# Patient Record
Sex: Male | Born: 1995 | Race: White | Hispanic: Yes | Marital: Married
Health system: Southern US, Community
[De-identification: ages and names within clinical notes are randomized; demographics above are authoritative.]

---

## 2020-03-22 ENCOUNTER — Other Ambulatory Visit: Payer: Self-pay

## 2020-03-22 ENCOUNTER — Emergency Department
Admission: EM | Admit: 2020-03-22 | Discharge: 2020-03-22 | Disposition: A | Discharge status: Home / Self Care | Payer: Self-pay | Attending: Emergency Medicine | Admitting: Emergency Medicine

## 2020-03-22 ENCOUNTER — Encounter: Payer: Self-pay | Admitting: Emergency Medicine

## 2020-03-22 ENCOUNTER — Emergency Department: Payer: Self-pay

## 2020-03-22 DIAGNOSIS — R0602 Shortness of breath: Secondary | ICD-10-CM | POA: Insufficient documentation

## 2020-03-22 DIAGNOSIS — Y904 Blood alcohol level of 80-99 mg/100 ml: Secondary | ICD-10-CM | POA: Insufficient documentation

## 2020-03-22 DIAGNOSIS — R079 Chest pain, unspecified: Secondary | ICD-10-CM

## 2020-03-22 DIAGNOSIS — M79662 Pain in left lower leg: Secondary | ICD-10-CM | POA: Insufficient documentation

## 2020-03-22 DIAGNOSIS — K2920 Alcoholic gastritis without bleeding: Secondary | ICD-10-CM

## 2020-03-22 LAB — LIPASE, BLOOD: Lipase: 42 U/L (ref 11–51)

## 2020-03-22 LAB — BASIC METABOLIC PANEL
Anion gap: 10 (ref 5–15)
BUN: 13 mg/dL (ref 6–20)
CO2: 22 mmol/L (ref 22–32)
Calcium: 8.7 mg/dL — ABNORMAL LOW (ref 8.9–10.3)
Chloride: 107 mmol/L (ref 98–111)
Creatinine, Ser: 0.8 mg/dL (ref 0.61–1.24)
GFR, Estimated: >60 mL/min (ref >60)
Glucose, Bld: 120 mg/dL — ABNORMAL HIGH (ref 70–99)
Potassium: 3.1 mmol/L — ABNORMAL LOW (ref 3.5–5.1)
Sodium: 139 mmol/L (ref 135–145)

## 2020-03-22 LAB — HEPATIC FUNCTION PANEL
ALT: 21 U/L (ref 0–44)
AST: 27 U/L (ref 15–41)
Albumin: 4.8 g/dL (ref 3.5–5.0)
Alkaline Phosphatase: 59 U/L (ref 38–126)
Bilirubin, Direct: 0.1 mg/dL (ref 0.0–0.2)
Indirect Bilirubin: 0.7 mg/dL (ref 0.3–0.9)
Total Bilirubin: 0.8 mg/dL (ref 0.3–1.2)
Total Protein: 7.1 g/dL (ref 6.5–8.1)

## 2020-03-22 LAB — CBC
HCT: 44.9 % (ref 39.0–52.0)
Hemoglobin: 16.1 g/dL (ref 13.0–17.0)
MCH: 33.5 pg (ref 26.0–34.0)
MCHC: 35.9 g/dL (ref 30.0–36.0)
MCV: 93.5 fL (ref 80.0–100.0)
Platelets: 199 10*3/uL (ref 150–400)
RBC: 4.8 MIL/uL (ref 4.22–5.81)
RDW: 10.9 % — ABNORMAL LOW (ref 11.5–15.5)
WBC: 8 10*3/uL (ref 4.0–10.5)
nRBC: 0 % (ref 0.0–0.2)

## 2020-03-22 LAB — TROPONIN I (HIGH SENSITIVITY)
Troponin I (High Sensitivity): <2 ng/L (ref <18)
Troponin I (High Sensitivity): <2 ng/L (ref <18)

## 2020-03-22 LAB — ETHANOL: Alcohol, Ethyl (B): 99 mg/dL — ABNORMAL HIGH (ref <10)

## 2020-03-22 LAB — D-DIMER, QUANTITATIVE: D-Dimer, Quant: <0.27 ug/mL-FEU (ref 0.00–0.50)

## 2020-03-22 MED ORDER — OMEPRAZOLE MAGNESIUM 20 MG PO TBEC: 20.0000 mg | DELAYED_RELEASE_TABLET | Freq: Every day | ORAL | 1 refills | Status: AC

## 2020-03-22 MED ORDER — ALUM & MAG HYDROXIDE-SIMETH 200-200-20 MG/5ML PO SUSP
15.0000 mL | Freq: Once | ORAL | Status: AC
  Administered 2020-03-22: 15 mL via ORAL
  Filled 2020-03-22: qty 30

## 2020-03-22 MED ORDER — LACTATED RINGERS IV BOLUS
1000.0000 mL | Freq: Once | INTRAVENOUS | Status: AC
  Administered 2020-03-22: 1000 mL via INTRAVENOUS

## 2020-03-22 MED ORDER — POTASSIUM CHLORIDE CRYS ER 20 MEQ PO TBCR
40.0000 meq | EXTENDED_RELEASE_TABLET | Freq: Once | ORAL | Status: AC
  Administered 2020-03-22: 40 meq via ORAL
  Filled 2020-03-22: qty 2

## 2020-03-22 MED ORDER — LIDOCAINE VISCOUS HCL 2 % MT SOLN
15.0000 mL | Freq: Once | OROMUCOSAL | Status: AC
  Administered 2020-03-22: 15 mL via ORAL
  Filled 2020-03-22: qty 15

## 2020-03-22 MED ORDER — ONDANSETRON HCL 4 MG/2ML IJ SOLN
4.0000 mg | Freq: Once | INTRAMUSCULAR | Status: AC
  Administered 2020-03-22: 4 mg via INTRAVENOUS
  Filled 2020-03-22: qty 2

## 2020-03-22 NOTE — ED Triage Notes (Addendum)
Information obtained with assist of spanish interpreter with [facility] interpreter. pAIN BEGAN five hours ago. Pt states has been nauseated and shob. Pt appears intoxicated, having difficulty obtaining information from pt as pt does not answer questions and is intermittently vomiting in triage.

## 2020-03-22 NOTE — ED Provider Notes (Signed)
[facility] Emergency Department Provider Note   ____________________________________________   Event Date/Time   First MD Initiated Contact with Patient 03/22/20 [id number]     (approximate)  I have reviewed the triage vital signs and the nursing notes.   HISTORY  Chief Complaint Chest Pain    HPI David Hunt is a 25 y.o. male with no significant past medical history who presents to the ED complaining of chest pain.  History is limited as patient is Spanish-speaking only and history obtained via in person interpreter.  Patient reports that for the past 24 hours he has been feeling nauseous with multiple episodes of vomiting, denies associated diarrhea but has some pain in his epigastrium.  He then developed pain in his chest about 5 hours ago.  He states this primarily affects the left side of his chest, is sharp and intermittent.  It is not exacerbated or alleviated by anything in particular and he states he feels a little short of breath when the pain is bad, denies fevers or cough.  He does admit to alcohol consumption overnight, about 5 or 6 beers, but denies drug use.  He denies any medical problems or cardiac history.  He does report some soreness in his left calf for the past couple of weeks, has not noticed any redness or swelling.        History reviewed. No pertinent past medical history.  There are no problems to display for this patient.   History reviewed. No pertinent surgical history.  Prior to Admission medications   Medication Sig Start Date End Date Taking? Authorizing Provider  omeprazole (PRILOSEC OTC) 20 MG tablet Take 1 tablet (20 mg total) by mouth daily. 03/22/20 03/22/21 Yes Chesley Noon, MD    Allergies Patient has no known allergies.  No family history on file.  Social History    Review of Systems  Constitutional: No fever/chills Eyes: No visual changes. ENT: No sore throat. Cardiovascular: Positive for chest  pain. Respiratory: Positive for shortness of breath. Gastrointestinal: Positive for abdominal pain, nausea, and vomiting.  No diarrhea.  No constipation. Genitourinary: Negative for dysuria. Musculoskeletal: Negative for back pain.  Positive for left leg pain. Skin: Negative for rash. Neurological: Negative for headaches, focal weakness or numbness.  ____________________________________________   PHYSICAL EXAM:  VITAL SIGNS: ED Triage Vitals  Enc Vitals Group     BP 03/22/20 0625 126/76     Pulse Rate 03/22/20 0625 (!) 123     Resp 03/22/20 0625 16     Temp 03/22/20 0634 98.1 F (36.7 C)     Temp Source 03/22/20 0634 Oral     SpO2 03/22/20 0625 99 %     Weight 03/22/20 0616 140 lb (63.5 kg)     Height 03/22/20 0616 5\' 4"  (1.626 m)     Head Circumference --      Peak Flow --      Pain Score 03/22/20 0610 8     Pain Loc --      Pain Edu? --      Excl. in GC? --     Constitutional: Alert and oriented. Eyes: Conjunctivae are normal. Head: Atraumatic. Nose: No congestion/rhinnorhea. Mouth/Throat: Mucous membranes are moist. Neck: Normal ROM Cardiovascular: Tachycardic, regular rhythm. Grossly normal heart sounds. Respiratory: Normal respiratory effort.  No retractions. Lungs CTAB.  No chest wall tenderness to palpation. Gastrointestinal: Soft and nontender. No distention. Genitourinary: deferred Musculoskeletal: No lower extremity tenderness nor edema. Neurologic:  Normal speech and language.  No gross focal neurologic deficits are appreciated. Skin:  Skin is warm, dry and intact. No rash noted. Psychiatric: Mood and affect are normal. Speech and behavior are normal.  ____________________________________________   LABS (all labs ordered are listed, but only abnormal results are displayed)  Labs Reviewed  BASIC METABOLIC PANEL - Abnormal; Notable for the following components:      Result Value   Potassium 3.1 (*)    Glucose, Bld 120 (*)    Calcium 8.7 (*)    All  other components within normal limits  CBC - Abnormal; Notable for the following components:   RDW 10.9 (*)    All other components within normal limits  ETHANOL - Abnormal; Notable for the following components:   Alcohol, Ethyl (B) 99 (*)    All other components within normal limits  D-DIMER, QUANTITATIVE  LIPASE, BLOOD  HEPATIC FUNCTION PANEL  TROPONIN I (HIGH SENSITIVITY)  TROPONIN I (HIGH SENSITIVITY)   ____________________________________________  EKG  ED ECG REPORT I, Chesley Noon, the attending physician, personally viewed and interpreted this ECG.   Date: 03/22/2020  EKG Time: 6:18  Rate: 123  Rhythm: sinus tachycardia  Axis: RAD  Intervals:none  ST&T Change: None   PROCEDURES  Procedure(s) performed (including Critical Care):  Procedures   ____________________________________________   INITIAL IMPRESSION / ASSESSMENT AND PLAN / ED COURSE       25 year old male with no significant past medical history presents to the ED with 24 hours of nausea and vomiting with some epigastric soreness, subsequently developed sharp pain in his left chest about 5 hours ago.  EKG shows no evidence of arrhythmia or ischemia, does show sinus tachycardia.  Troponin is negative and symptoms are very atypical for ACS with minimal risk factors.  Chest x-ray reviewed by me and shows no infiltrate, edema, or effusion.  With his tachycardia and reported left calf soreness, we will check D-dimer to further assess for PE as he is low risk by [place].  Left lower extremity appears normal to inspection with no tenderness, edema, or erythema.  Patient is slightly intoxicated and with his vomiting his chest pain seems most likely related to GERD and esophagitis.  We will treat with Zofran and GI cocktail while awaiting results.  D-dimer is within normal limits and patient's tachycardia has improved following IV fluids.  Repeat troponin is also negative and patient reports feeling much better  following GI cocktail.  Suspect his symptoms are related to GERD and a component of alcoholic gastritis.  We will start patient on a PPI and he is appropriate for discharge home with PCP follow-up.  He was counseled to return to the ED for new worsening symptoms, patient agrees with plan.      ____________________________________________   FINAL CLINICAL IMPRESSION(S) / ED DIAGNOSES  Final diagnoses:  Nonspecific chest pain  Acute alcoholic gastritis without hemorrhage     ED Discharge Orders         Ordered    omeprazole (PRILOSEC OTC) 20 MG tablet  Daily        03/22/20 0933           Note:  This document was prepared using Dragon voice recognition software and may include unintentional dictation errors.   Chesley Noon, MD 03/22/20 [id number]

## 2022-04-11 IMAGING — CR DG CHEST 2V
1 series · 2 of 2 positions shown · non-contrast
Comparison: No pertinent prior exams available for comparison.

CLINICAL DATA: Chest pain. Additional history provided: Nausea and
shortness of breath.

EXAM:
CHEST - 2 VIEW

[Series 1: dg chest 2 view · 0.14mm/px · 2 of 2 slices shown]
[im 1/2]
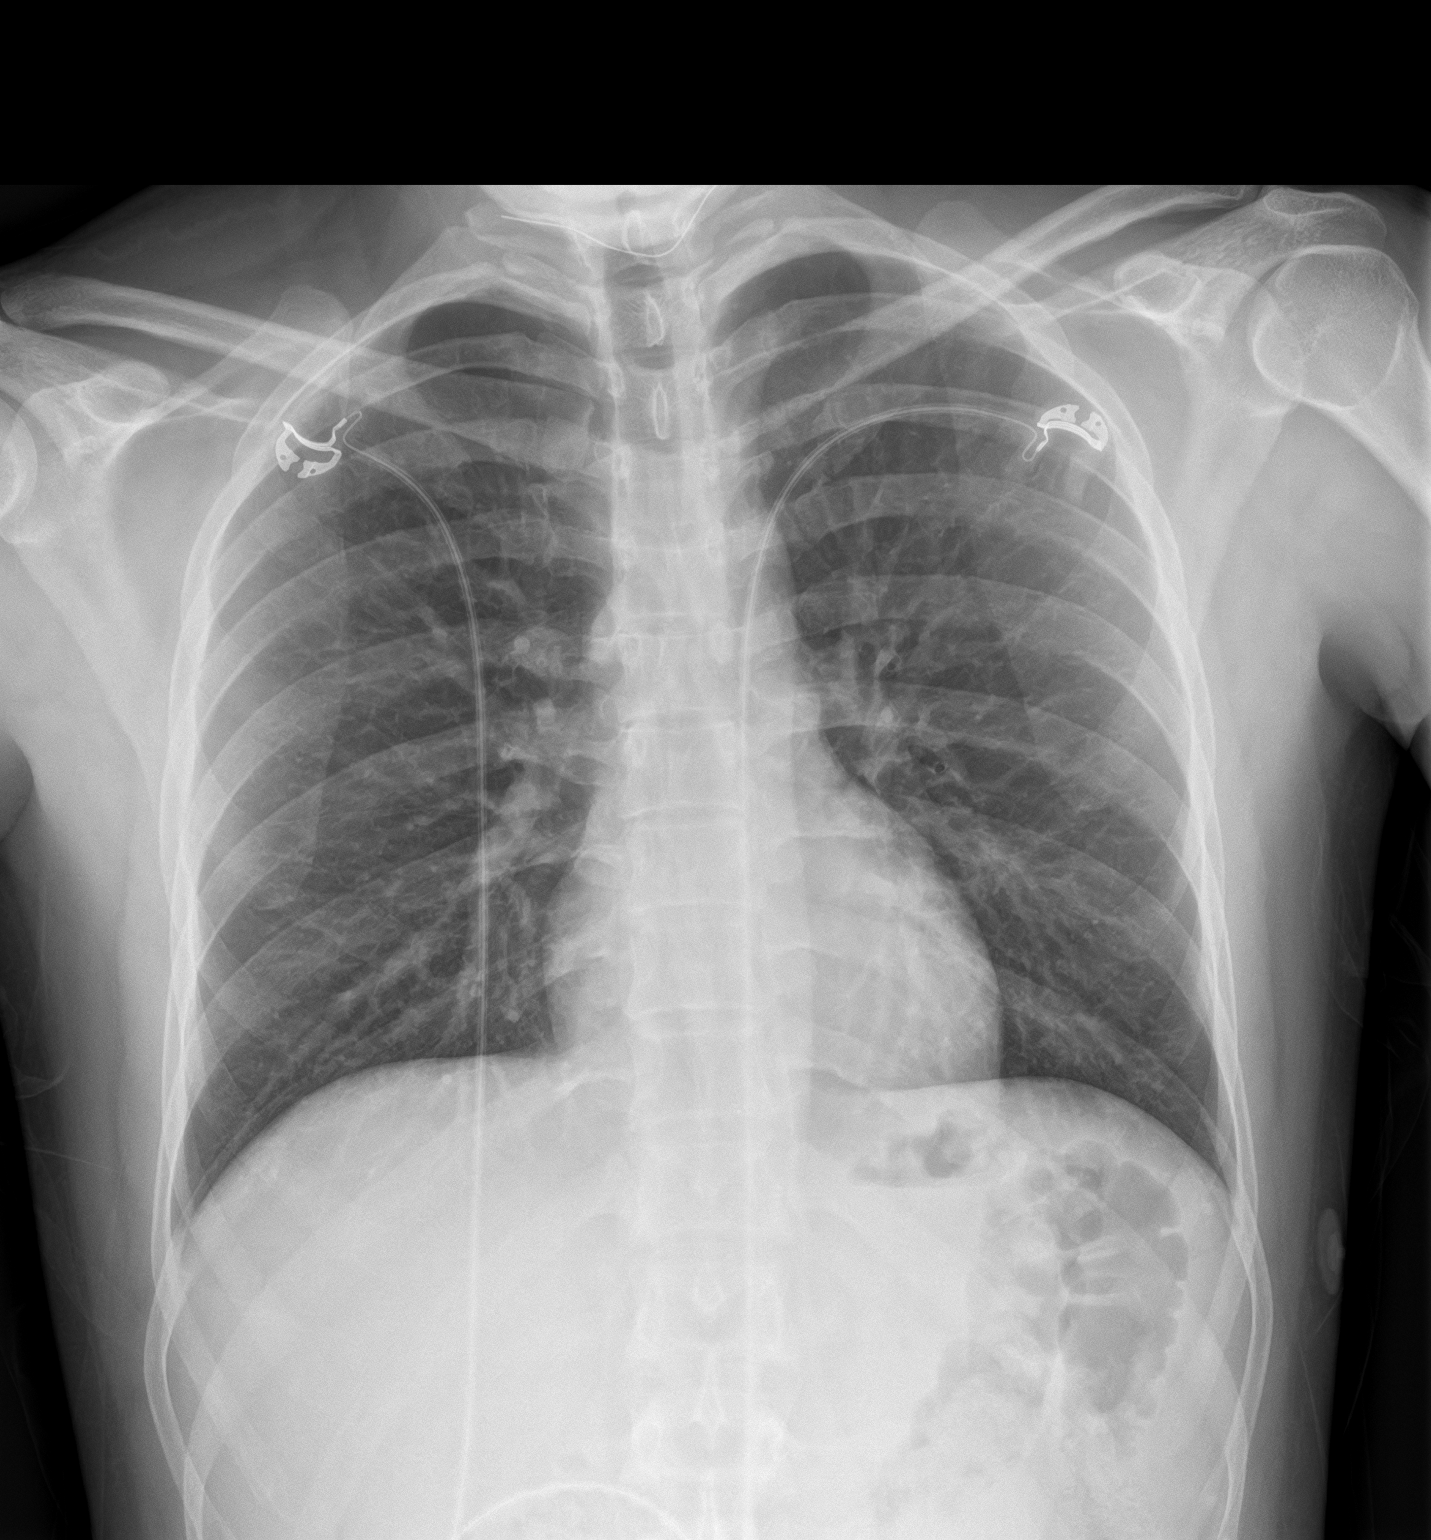
[im 2/2]
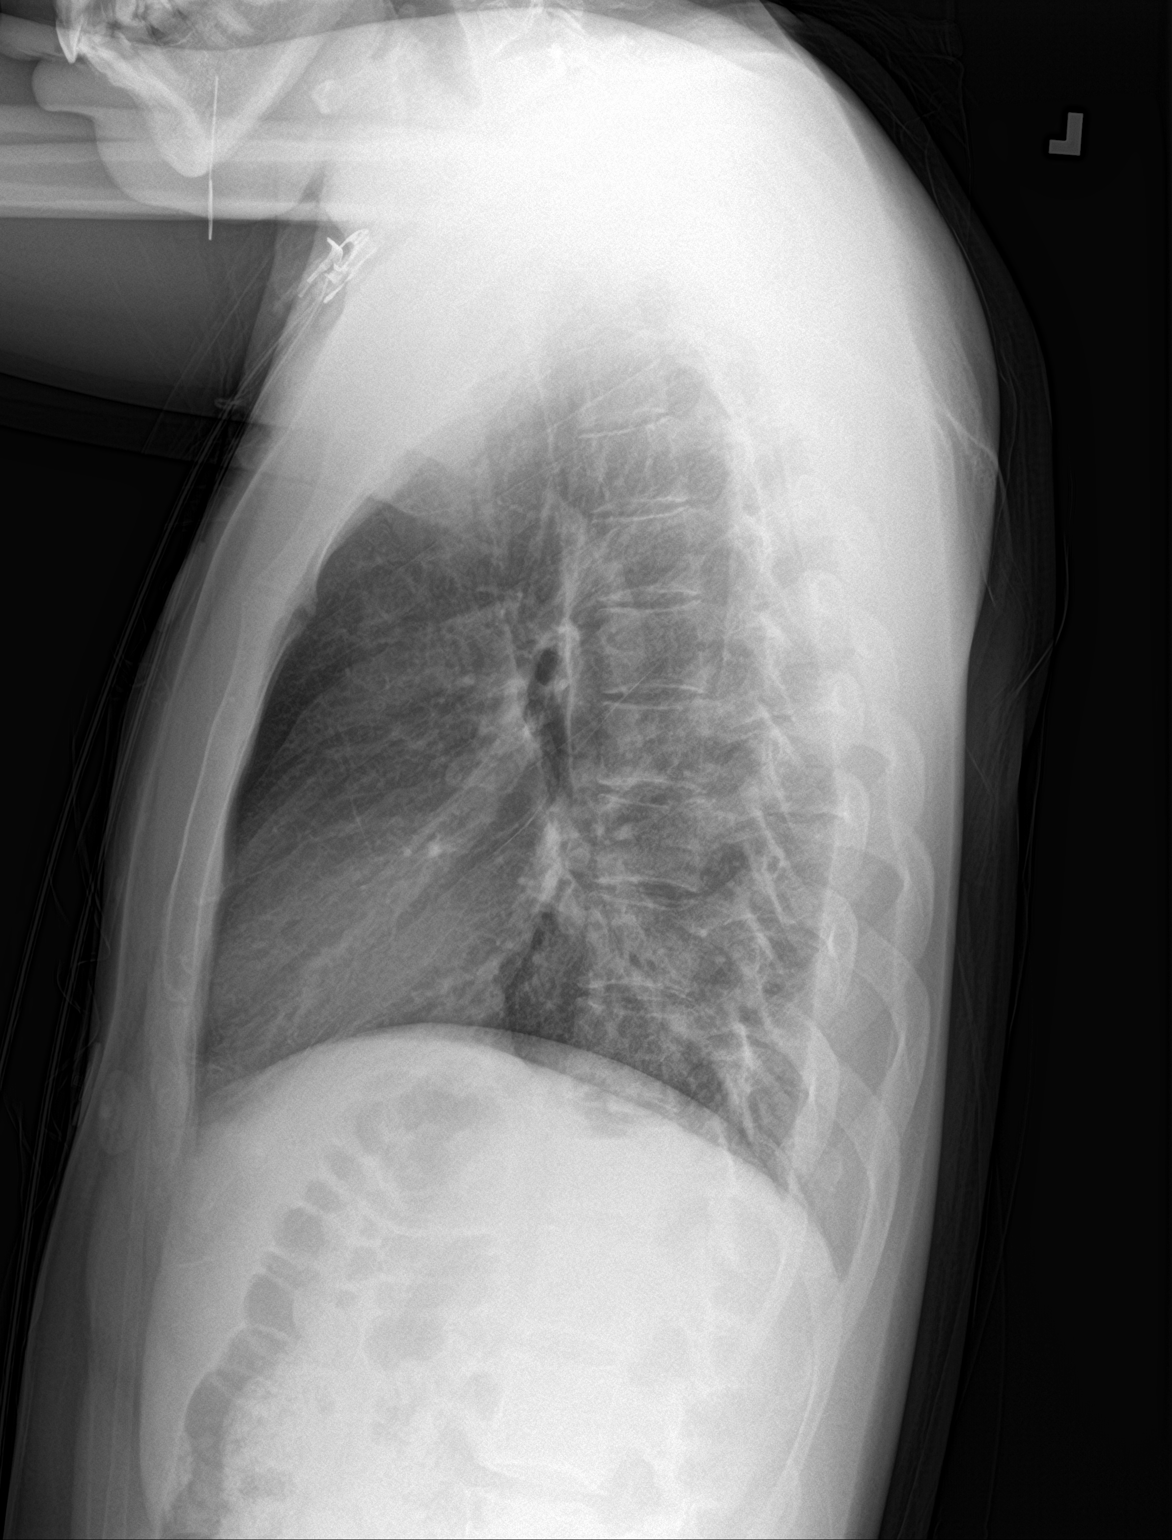

[2 of 2 positions shown; findings below may reference images not displayed]

FINDINGS: Heart size within normal limits. No appreciable airspace
consolidation. No evidence of pleural effusion or pneumothorax. No
acute bony abnormality identified.
IMPRESSION: No evidence of active cardiopulmonary disease.
